# Patient Record
Sex: Male | Born: 1995 | Race: White | Hispanic: No | Marital: Single | Smoking: Never smoker
Health system: Southern US, Community
[De-identification: ages and names within clinical notes are randomized; demographics above are authoritative.]

## PROBLEM LIST (undated history)

## (undated) ENCOUNTER — Emergency Department (HOSPITAL_COMMUNITY): Admission: EM | Disposition: A | Discharge status: Home / Self Care | Payer: BLUE CROSS/BLUE SHIELD

---

## 2017-03-19 ENCOUNTER — Emergency Department (HOSPITAL_COMMUNITY): Payer: BLUE CROSS/BLUE SHIELD

## 2017-03-19 ENCOUNTER — Emergency Department (HOSPITAL_COMMUNITY)
Admission: EM | Admit: 2017-03-19 | Discharge: 2017-03-20 | Disposition: A | Discharge status: Home / Self Care | Payer: BLUE CROSS/BLUE SHIELD | Attending: Emergency Medicine | Admitting: Emergency Medicine

## 2017-03-19 ENCOUNTER — Encounter (HOSPITAL_COMMUNITY): Payer: Self-pay

## 2017-03-19 DIAGNOSIS — M62838 Other muscle spasm: Secondary | ICD-10-CM | POA: Insufficient documentation

## 2017-03-19 DIAGNOSIS — R079 Chest pain, unspecified: Secondary | ICD-10-CM | POA: Diagnosis present

## 2017-03-19 DIAGNOSIS — R0789 Other chest pain: Secondary | ICD-10-CM

## 2017-03-19 LAB — BASIC METABOLIC PANEL
ANION GAP: 11 (ref 5–15)
BUN: 16 mg/dL (ref 6–20)
CO2: 26 mmol/L (ref 22–32)
Calcium: 9.9 mg/dL (ref 8.9–10.3)
Chloride: 101 mmol/L (ref 101–111)
Creatinine, Ser: 1.23 mg/dL (ref 0.61–1.24)
GFR calc Af Amer: >60 mL/min (ref >60)
Glucose, Bld: 101 mg/dL — ABNORMAL HIGH (ref 65–99)
POTASSIUM: 3.6 mmol/L (ref 3.5–5.1)
SODIUM: 138 mmol/L (ref 135–145)

## 2017-03-19 LAB — CBC
HEMATOCRIT: 42.2 % (ref 39.0–52.0)
HEMOGLOBIN: 15.5 g/dL (ref 13.0–17.0)
MCH: 32.4 pg (ref 26.0–34.0)
MCHC: 36.7 g/dL — ABNORMAL HIGH (ref 30.0–36.0)
MCV: 88.1 fL (ref 78.0–100.0)
Platelets: 176 10*3/uL (ref 150–400)
RBC: 4.79 MIL/uL (ref 4.22–5.81)
RDW: 12.7 % (ref 11.5–15.5)
WBC: 10.8 10*3/uL — AB (ref 4.0–10.5)

## 2017-03-19 LAB — I-STAT TROPONIN, ED: Troponin i, poc: 0.01 ng/mL (ref 0.00–0.08)

## 2017-03-19 NOTE — ED Triage Notes (Signed)
Pt was working out tonight and started feeling sharp pains in his chest when he was done for about two minutes, he then said it took about 15 minutes to feel back to normal

## 2017-03-20 NOTE — ED Provider Notes (Signed)
[facility]-EMERGENCY DEPT Provider Note   CSN: [id number] Arrival date & time: 03/19/17  2056     History   Chief Complaint Chief Complaint  Patient presents with  . Chest Pain    HPI Joshua Griffith is a 21 y.o. male.   Patient here for evaluation of sharp, brief left lower chest pain that occurred earlier tonight. No current symptoms. No SOB, nausea, diaphoresis at the time of pain. He reports he had just finished working out at [employer] and was finishing his stretching routine when symptoms began. No pain during aggressive work out. He lifted his left arm and stretched the area and states this improved symptoms, but it took another 10 minutes for the pain to completely resolve. No radiation. This same thing happened 2 months ago while driving. It was of the same quality and duration as tonight. He is otherwise healthy, non-smoker.   The history is provided by the patient. No language interpreter was used.    History reviewed. No pertinent past medical history.  There are no active problems to display for this patient.   History reviewed. No pertinent surgical history.     Home Medications    Prior to Admission medications   Not on File    Family History History reviewed. No pertinent family history.  Social History Social History   Tobacco Use  . Smoking status: Never Smoker  . Smokeless tobacco: Never Used  Substance Use Topics  . Alcohol use: No    Frequency: Never  . Drug use: No     Allergies   Patient has no allergy information on record.   Review of Systems Review of Systems  Constitutional: Negative for chills and fever.  HENT: Negative.   Respiratory: Negative.  Negative for shortness of breath.   Cardiovascular: Positive for chest pain. Negative for palpitations.  Gastrointestinal: Negative.  Negative for abdominal pain and nausea.  Musculoskeletal: Negative.  Negative for back pain.  Skin: Negative.     Neurological: Negative.  Negative for weakness.     Physical Exam Updated Vital Signs BP 126/84 (BP Location: Left Arm)   Pulse 76   Temp 97.7 F (36.5 C) (Oral)   Resp (!) 22   Ht 5\' 11"  (1.803 m)   Wt 78.5 kg (173 lb)   SpO2 100%   BMI 24.13 kg/m   Physical Exam  Constitutional: He is oriented to person, place, and time. He appears well-developed and well-nourished. No distress.  HENT:  Head: Normocephalic.  Neck: Normal range of motion. Neck supple.  Cardiovascular: Normal rate and regular rhythm.  No murmur heard. Pulmonary/Chest: Effort normal and breath sounds normal. He has no wheezes. He has no rhonchi. He has no rales.  No chest wall tenderness.  Abdominal: Soft. Bowel sounds are normal. There is no tenderness. There is no rebound and no guarding.  Musculoskeletal: Normal range of motion.       Right lower leg: Normal. He exhibits no edema.  Neurological: He is alert and oriented to person, place, and time.  Skin: Skin is warm and dry. No rash noted.  Psychiatric: He has a normal mood and affect.     ED Treatments / Results  Labs (all labs ordered are listed, but only abnormal results are displayed) Labs Reviewed  BASIC METABOLIC PANEL - Abnormal; Notable for the following components:      Result Value   Glucose, Bld 101 (*)    All other components within normal limits  CBC - Abnormal; Notable for the following components:   WBC 10.8 (*)    MCHC 36.7 (*)    All other components within normal limits  I-STAT TROPONIN, ED   Results for orders placed or performed during the hospital encounter of 03/19/17  Basic metabolic panel  Result Value Ref Range   Sodium 138 135 - 145 mmol/L   Potassium 3.6 3.5 - 5.1 mmol/L   Chloride 101 101 - 111 mmol/L   CO2 26 22 - 32 mmol/L   Glucose, Bld 101 (H) 65 - 99 mg/dL   BUN 16 6 - 20 mg/dL   Creatinine, Ser 0.861.23 0.61 - 1.24 mg/dL   Calcium 9.9 8.9 - 57.810.3 mg/dL   GFR calc non Af Amer >60 >60 mL/min   GFR calc Af  Amer >60 >60 mL/min   Anion gap 11 5 - 15  CBC  Result Value Ref Range   WBC 10.8 (H) 4.0 - 10.5 K/uL   RBC 4.79 4.22 - 5.81 MIL/uL   Hemoglobin 15.5 13.0 - 17.0 g/dL   HCT 46.942.2 62.939.0 - 52.852.0 %   MCV 88.1 78.0 - 100.0 fL   MCH 32.4 26.0 - 34.0 pg   MCHC 36.7 (H) 30.0 - 36.0 g/dL   RDW 41.312.7 24.411.5 - 01.015.5 %   Platelets 176 150 - 400 K/uL  I-stat troponin, ED  Result Value Ref Range   Troponin i, poc 0.01 0.00 - 0.08 ng/mL   Comment 3            EKG  EKG Interpretation  Date/Time:  Tuesday March 19 2017 21:04:46 EST Ventricular Rate:  75 PR Interval:    QRS Duration: 97 QT Interval:  351 QTC Calculation: 392 R Axis:   78 Text Interpretation:  Sinus or ectopic atrial rhythm ST elev, probable normal early repol pattern Baseline wander in lead(s) V2 V3 benign early repole Confirmed by MaldenMackuen, Courteney ([id number]) on 03/19/2017 10:34:52 PM       Radiology Dg Chest 2 View  Result Date: 03/19/2017 CLINICAL DATA:  Mid chest pain EXAM: CHEST  2 VIEW COMPARISON:  None. FINDINGS: The heart size and mediastinal contours are within normal limits. Both lungs are clear. The visualized skeletal structures are unremarkable. IMPRESSION: No active cardiopulmonary disease. Electronically Signed   By: Jasmine PangKim  Fujinaga M.D.   On: 03/19/2017 21:45    Procedures Procedures (including critical care time)  Medications Ordered in ED Medications - No data to display   Initial Impression / Assessment and Plan / ED Course  I have reviewed the triage vital signs and the nursing notes.  Pertinent labs & imaging results that were available during my care of the patient were reviewed by me and considered in my medical decision making (see chart for details).     Patient with 10-minute episode sharp left lower chest pain. Second episode in 2 months. He is otherwise healthy, heart score 0. Normal EKG, CXR, labs, including troponin.   Suspect muscular chest wall spasm. Do not have concern for ACS, PE, PTX.  He can be discharged with prn PCP follow up.  Final Clinical Impressions(s) / ED Diagnoses   Final diagnoses:  None   1. Chest wall spasm  ED Discharge Orders    None       Elpidio AnisUpstill, Finley Dinkel, PA-C 03/20/17 0107    Derwood KaplanNanavati, Ankit, MD 03/21/17 [phone]

## 2018-12-01 IMAGING — CR DG CHEST 2V
2 series · 2 of 2 positions shown · non-contrast
Comparison: None.

CLINICAL DATA: Mid chest pain

EXAM:
CHEST  2 VIEW

[w chest pa]
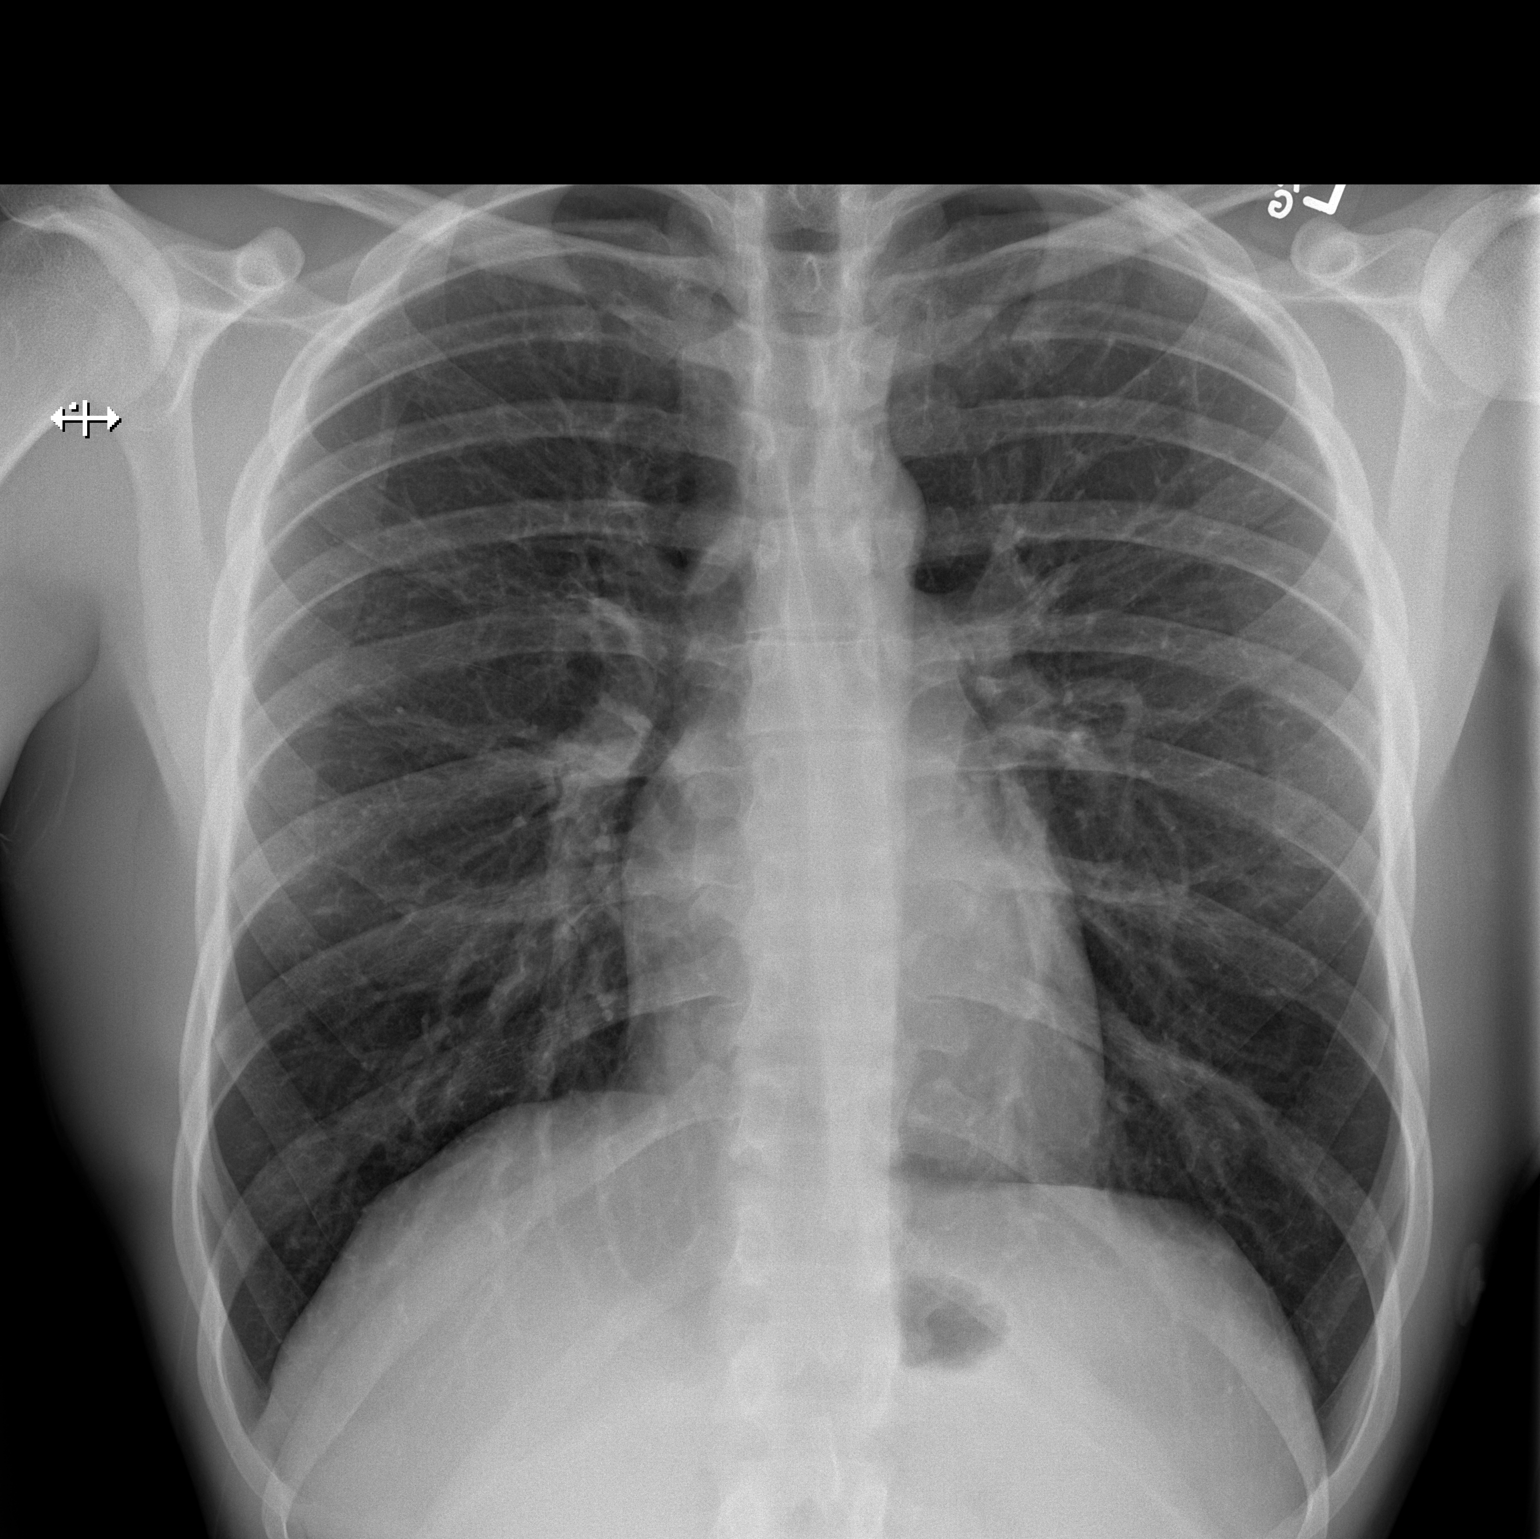

[w chest lat]
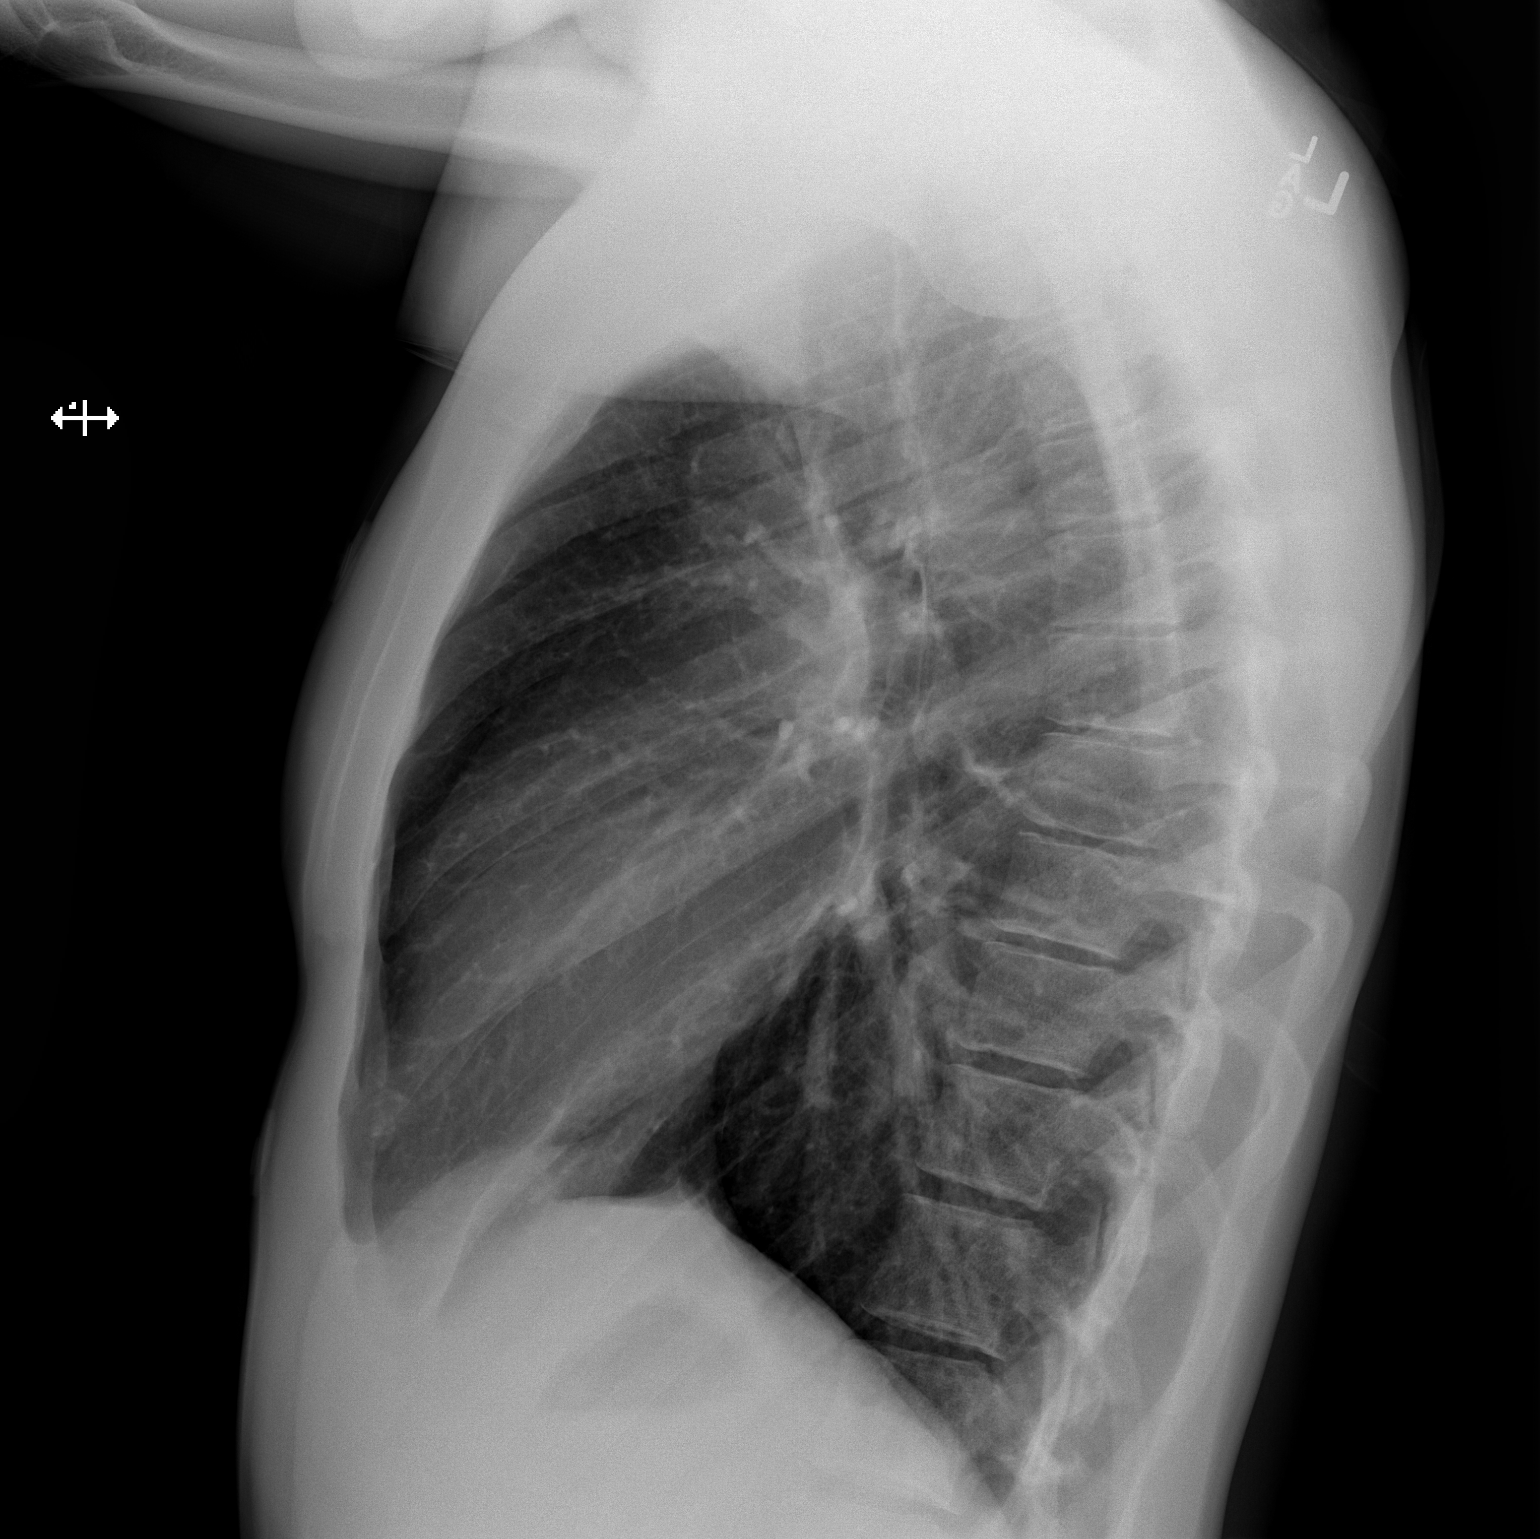

[2 of 2 positions shown; findings below may reference images not displayed]

FINDINGS: The heart size and mediastinal contours are within normal limits.
Both lungs are clear. The visualized skeletal structures are
unremarkable.
IMPRESSION: No active cardiopulmonary disease.
